# Patient Record
Sex: Male | Born: 1995 | Race: White | Hispanic: No | Marital: Single | Smoking: Never smoker
Health system: Southern US, Community
[De-identification: ages and names within clinical notes are randomized; demographics above are authoritative.]

## PROBLEM LIST (undated history)

## (undated) HISTORY — PX: HAND SURGERY: SHX662

---

## 2017-02-19 ENCOUNTER — Other Ambulatory Visit: Payer: Self-pay

## 2017-02-19 ENCOUNTER — Emergency Department (HOSPITAL_COMMUNITY)
Admission: EM | Admit: 2017-02-19 | Discharge: 2017-02-19 | Disposition: A | Discharge status: Home / Self Care | Payer: BLUE CROSS/BLUE SHIELD | Attending: Emergency Medicine | Admitting: Emergency Medicine

## 2017-02-19 ENCOUNTER — Encounter (HOSPITAL_COMMUNITY): Payer: Self-pay | Admitting: Emergency Medicine

## 2017-02-19 DIAGNOSIS — Z79899 Other long term (current) drug therapy: Secondary | ICD-10-CM | POA: Diagnosis not present

## 2017-02-19 DIAGNOSIS — R1013 Epigastric pain: Secondary | ICD-10-CM

## 2017-02-19 LAB — CBC
HEMATOCRIT: 43.1 % (ref 39.0–52.0)
Hemoglobin: 15 g/dL (ref 13.0–17.0)
MCH: 29.9 pg (ref 26.0–34.0)
MCHC: 34.8 g/dL (ref 30.0–36.0)
MCV: 86 fL (ref 78.0–100.0)
PLATELETS: 211 10*3/uL (ref 150–400)
RBC: 5.01 MIL/uL (ref 4.22–5.81)
RDW: 12.4 % (ref 11.5–15.5)
WBC: 8.9 10*3/uL (ref 4.0–10.5)

## 2017-02-19 LAB — COMPREHENSIVE METABOLIC PANEL
ALT: 12 U/L — ABNORMAL LOW (ref 17–63)
AST: 22 U/L (ref 15–41)
Albumin: 4.2 g/dL (ref 3.5–5.0)
Alkaline Phosphatase: 52 U/L (ref 38–126)
Anion gap: 6 (ref 5–15)
BILIRUBIN TOTAL: 0.8 mg/dL (ref 0.3–1.2)
BUN: 15 mg/dL (ref 6–20)
CO2: 27 mmol/L (ref 22–32)
Calcium: 9.5 mg/dL (ref 8.9–10.3)
Chloride: 104 mmol/L (ref 101–111)
Creatinine, Ser: 0.83 mg/dL (ref 0.61–1.24)
Glucose, Bld: 93 mg/dL (ref 65–99)
POTASSIUM: 4.1 mmol/L (ref 3.5–5.1)
Sodium: 137 mmol/L (ref 135–145)
TOTAL PROTEIN: 7.1 g/dL (ref 6.5–8.1)

## 2017-02-19 LAB — URINALYSIS, ROUTINE W REFLEX MICROSCOPIC
Bilirubin Urine: NEGATIVE
Glucose, UA: NEGATIVE mg/dL
Hgb urine dipstick: NEGATIVE
KETONES UR: NEGATIVE mg/dL
LEUKOCYTES UA: NEGATIVE
NITRITE: NEGATIVE
PH: 5 (ref 5.0–8.0)
PROTEIN: NEGATIVE mg/dL
Specific Gravity, Urine: 1.029 (ref 1.005–1.030)

## 2017-02-19 LAB — LIPASE, BLOOD: Lipase: 22 U/L (ref 11–51)

## 2017-02-19 MED ORDER — ALUM & MAG HYDROXIDE-SIMETH 400-400-40 MG/5ML PO SUSP: 5.0000 mL | Freq: Four times a day (QID) | ORAL | 0 refills | Status: AC | PRN

## 2017-02-19 MED ORDER — GI COCKTAIL ~~LOC~~
30.0000 mL | Freq: Once | ORAL | Status: AC
  Administered 2017-02-19: 30 mL via ORAL
  Filled 2017-02-19: qty 30

## 2017-02-19 MED ORDER — ESOMEPRAZOLE MAGNESIUM 40 MG PO CPDR: 40.0000 mg | DELAYED_RELEASE_CAPSULE | Freq: Every day | ORAL | 0 refills | Status: AC

## 2017-02-19 NOTE — ED Notes (Signed)
Pt c/o upper abd pain onset 19:00 last night. He states that he has had this pain before intermittently always when waking up. He used to have n/v with these episodes, but denies n/v today. Today, he tried Tums and ibuprofen.

## 2017-02-19 NOTE — ED Notes (Signed)
PA at bedside.

## 2017-02-19 NOTE — ED Notes (Signed)
PA advises that pt may have a GI cocktail and be d/c

## 2017-02-19 NOTE — Discharge Instructions (Signed)
Continue to use tums as needed for pain. Avoid ibuprofen, as this may not be the best medicine for your stomach pain. Take tylenol instead.  Start taking nexium once daily.  Follow up with the stomach doctors for further evaluation of your symptoms.  Return to the ER if you develop fevers, persistent vomiting, worsening pain, or any new or worsening symptoms.

## 2017-02-19 NOTE — ED Triage Notes (Signed)
Patient complaining of upper abdominal pain that started around 7 pm last night. Patient states it was a mild pain that started before dinner. Patient does not have nausea, vomiting, or diarrhea.

## 2017-02-19 NOTE — ED Provider Notes (Signed)
[facility]ENCY DEPT Provider Note   CSN: [id number] Arrival date & time: 02/19/17  [id number]     History   Chief Complaint Chief Complaint  Patient presents with  . Abdominal Pain    HPI Cory Gentry is a 21 y.o. male presenting with abdominal pain.  Patient states that at 7:00 last night, he started to have some epigastric abdominal pain.  It is described as a pressure or squeezing.  This began prior to dinner, and was mild until about 3 AM when the pain became more severe.  He took Tums and ibuprofen and a probiotic pill and had no improvement of his pain.  Pain is waxing and waning.  He denies associated nausea, vomiting, diarrhea, or constipation.  There is no pain in his lower abdomen.  He reports intermittent episodes of similar symptoms over the past several years, it has never been worked up before.  He states symptoms usually resolve with Tums and ibuprofen.  He denies fevers, chills, chest pain, shortness of breath, abdominal bloating, urinary symptoms, abnormal bowel movements.  He has no other medical problems, does not take medications daily.  He reports at this time, the pain has started to ease off.  He does not want anything for pain at this time.  HPI  History reviewed. No pertinent past medical history.  There are no active problems to display for this patient.   Past Surgical History:  Procedure Laterality Date  . HAND SURGERY         Home Medications    Prior to Admission medications   Medication Sig Start Date End Date Taking? Authorizing Provider  calcium carbonate (TUMS - DOSED IN MG ELEMENTAL CALCIUM) 500 MG chewable tablet Chew 1 tablet by mouth daily as needed for indigestion or heartburn.   Yes [provider]  ibuprofen (ADVIL,MOTRIN) 200 MG tablet Take 400 mg by mouth every 6 (six) hours as needed for mild pain.   Yes [provider]  Probiotic Product (PROBIOTIC ADVANCED PO) Take 1 capsule by mouth daily.    Yes [provider]  alum & mag hydroxide-simeth (MAALOX MAX) 400-400-40 MG/5ML suspension Take 5 mLs by mouth every 6 (six) hours as needed for indigestion. 02/19/17   Piccola Arico, PA-C  esomeprazole (NEXIUM) 40 MG capsule Take 1 capsule (40 mg total) by mouth daily. 02/19/17   Dayshon Roback, PA-C    Family History History reviewed. No pertinent family history.  Social History Social History   Tobacco Use  . Smoking status: Never Smoker  . Smokeless tobacco: Never Used  Substance Use Topics  . Alcohol use: Yes    Frequency: Never  . Drug use: No     Allergies   Patient has no known allergies.   Review of Systems Review of Systems  Gastrointestinal: Positive for abdominal pain.  All other systems reviewed and are negative.    Physical Exam Updated Vital Signs BP 120/76   Pulse 64   Temp 97.7 F (36.5 C) (Oral)   Resp 16   Ht 6\' 1"  (1.854 m)   Wt 80.3 kg (177 lb)   SpO2 99%   BMI 23.35 kg/m   Physical Exam  Constitutional: He is oriented to person, place, and time. He appears well-developed and well-nourished. No distress.  HENT:  Head: Normocephalic and atraumatic.  Eyes: EOM are normal.  Neck: Normal range of motion.  Cardiovascular: Normal rate, regular rhythm and intact distal pulses.  Pulmonary/Chest: Effort normal and breath sounds normal.  No respiratory distress. He has no wheezes.  Abdominal: Soft. Bowel sounds are normal. He exhibits no distension and no mass. There is tenderness. There is no rebound and no guarding.  Abd soft without rigidity, guarding or distention. No rebound. Negative murphys. Mild TTP of epigastric area.   Musculoskeletal: Normal range of motion.  Neurological: He is alert and oriented to person, place, and time.  Skin: Skin is warm and dry.  Psychiatric: He has a normal mood and affect.  Nursing note and vitals reviewed.    ED Treatments / Results  Labs (all labs ordered are listed, but only abnormal  results are displayed) Labs Reviewed  COMPREHENSIVE METABOLIC PANEL - Abnormal; Notable for the following components:      Result Value   ALT 12 (*)    All other components within normal limits  LIPASE, BLOOD  CBC  URINALYSIS, ROUTINE W REFLEX MICROSCOPIC    EKG  EKG Interpretation None       Radiology No results found.  Procedures Procedures (including critical care time)  Medications Ordered in ED Medications  gi cocktail (Maalox,Lidocaine,Donnatal) (30 mLs Oral Given 02/19/17 0915)     Initial Impression / Assessment and Plan / ED Course  I have reviewed the triage vital signs and the nursing notes.  Pertinent labs & imaging results that were available during my care of the patient were reviewed by me and considered in my medical decision making (see chart for details).     Patient presenting for evaluation of abdominal pain.  Physical examination, he is afebrile, not tachycardic, appears nontoxic.  Symptoms are improving without intervention.  Will obtain basic abdominal labs.  Patient does not want anything for pain at this time.  Denies nausea.  Likely GERD/gastric etiology.  I do not believe imaging is necessary at this time, although will reevaluate after lab work is returned.  Labs reassuring, no leukocytosis.  No abnormality of the liver or bilirubin.  Likely GERD.  Discussed findings with patient.  Patient reports some increased symptoms prior to discharge, will give GI cocktail.  Nexium given for home use and patient to follow-up with GI.  At this time, patient appears safe for discharge.  Return precautions given.  Patient states he understands and agrees to plan.   Final Clinical Impressions(s) / ED Diagnoses   Final diagnoses:  Epigastric abdominal pain    ED Discharge Orders        Ordered    esomeprazole (NEXIUM) 40 MG capsule  Daily     02/19/17 0849    alum & mag hydroxide-simeth (MAALOX MAX) 400-400-40 MG/5ML suspension  Every 6 hours PRN      02/19/17 0902       Alveria ApleyCaccavale, Annalise Mcdiarmid, PA-C 02/19/17 1641    Ward, Layla MawKristen N, DO 02/19/17 2313

## 2017-04-04 ENCOUNTER — Other Ambulatory Visit: Payer: Self-pay | Admitting: Physician Assistant

## 2017-04-04 DIAGNOSIS — R1013 Epigastric pain: Secondary | ICD-10-CM

## 2017-04-04 DIAGNOSIS — R1033 Periumbilical pain: Secondary | ICD-10-CM

## 2017-04-07 ENCOUNTER — Ambulatory Visit
Admission: RE | Admit: 2017-04-07 | Discharge: 2017-04-07 | Disposition: A | Discharge status: Home / Self Care | Payer: BLUE CROSS/BLUE SHIELD | Source: Ambulatory Visit | Attending: Physician Assistant | Admitting: Physician Assistant

## 2017-04-07 DIAGNOSIS — R1013 Epigastric pain: Secondary | ICD-10-CM

## 2017-04-07 DIAGNOSIS — R1033 Periumbilical pain: Secondary | ICD-10-CM

## 2019-12-12 IMAGING — US US ABDOMEN COMPLETE
1 series · 14 of 25 positions shown · non-contrast
Comparison: None

CLINICAL DATA: Epigastric and periumbilical abdominal pain for 2
years

EXAM:
ABDOMEN ULTRASOUND COMPLETE

[Series 1: us abdomen complete · 0.14mm/px · 14 of 102 slices shown]
[im 1/102]
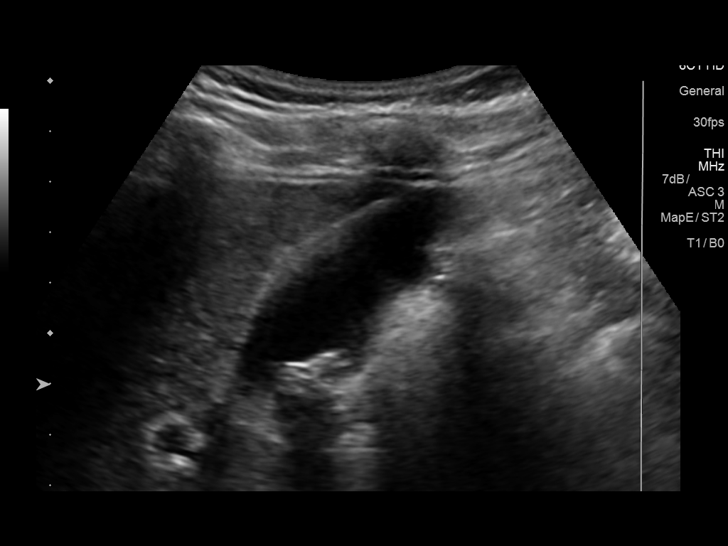
[im 9/102]
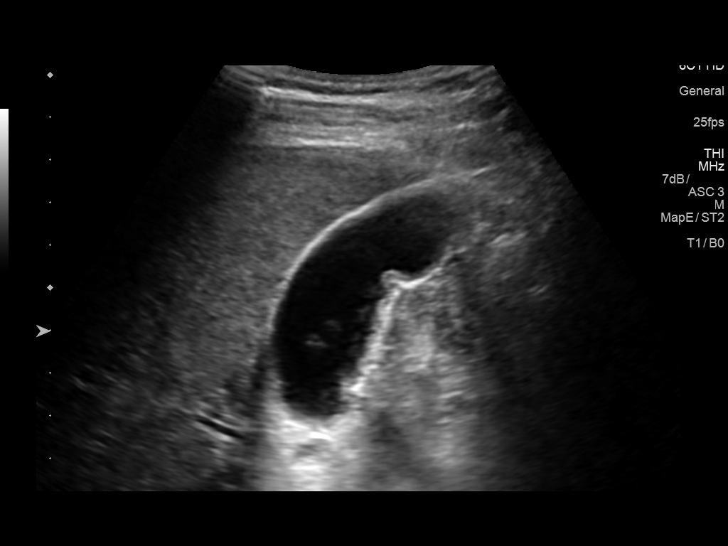
[im 17/102]
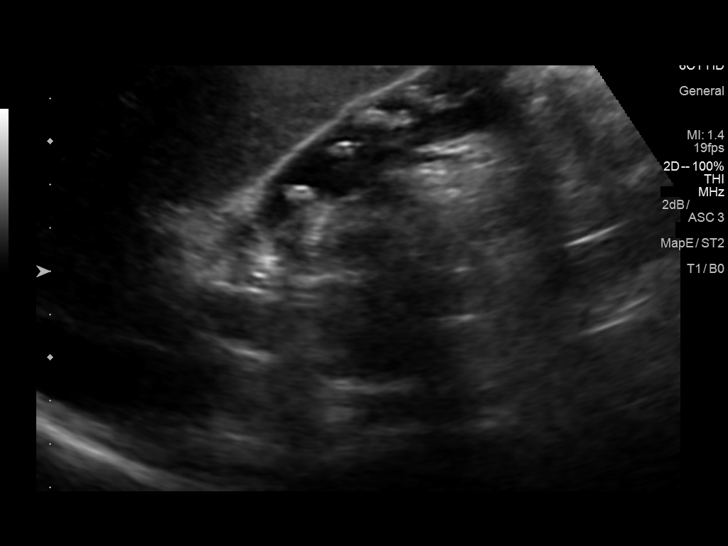
[im 26/102]
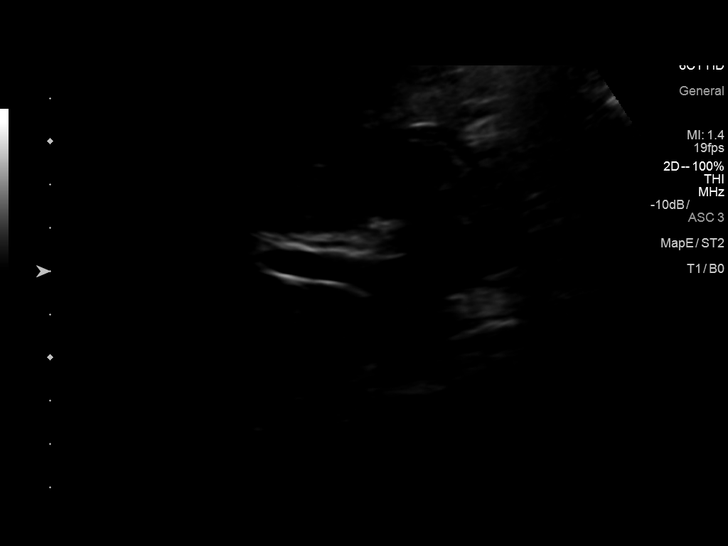
[im 34/102]
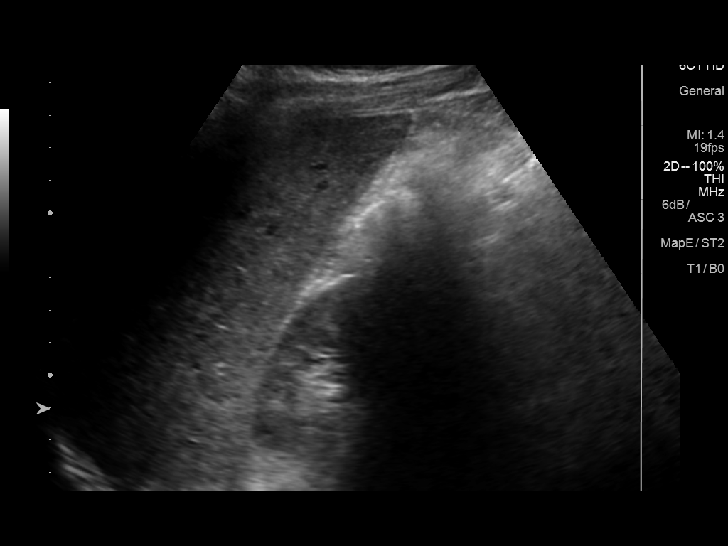
[im 38/102]
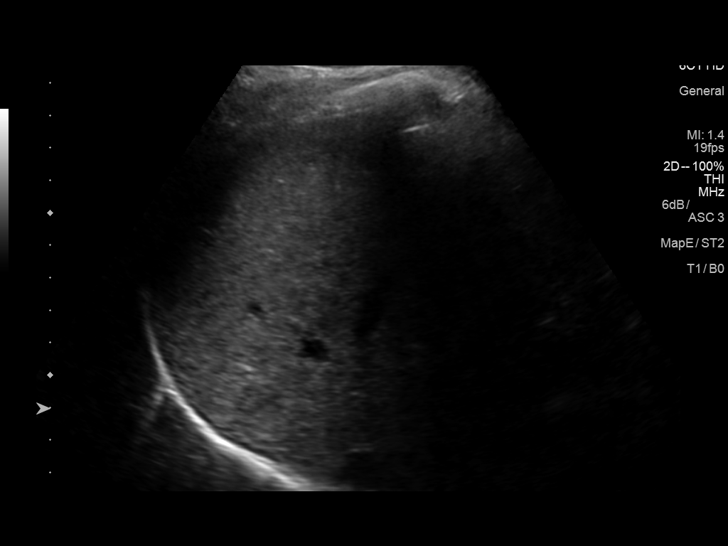
[im 47/102]
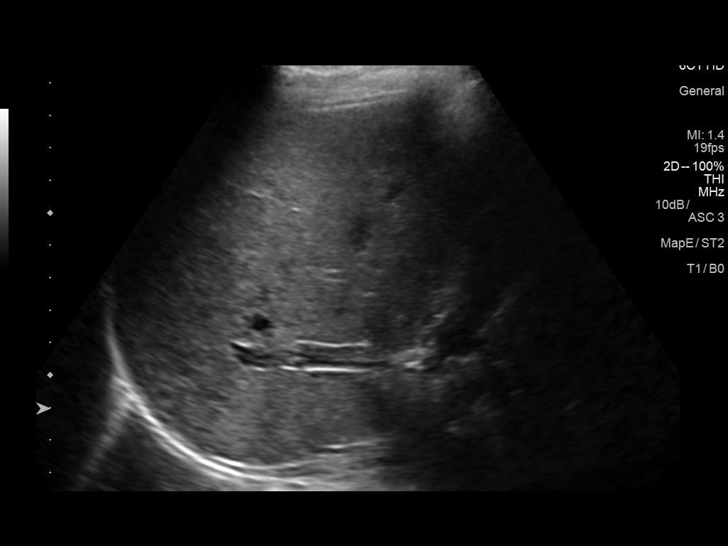
[im 55/102]
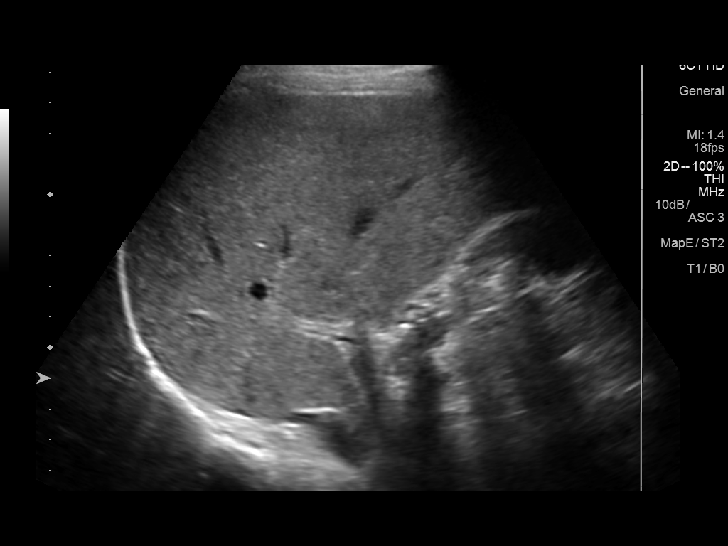
[im 64/102]
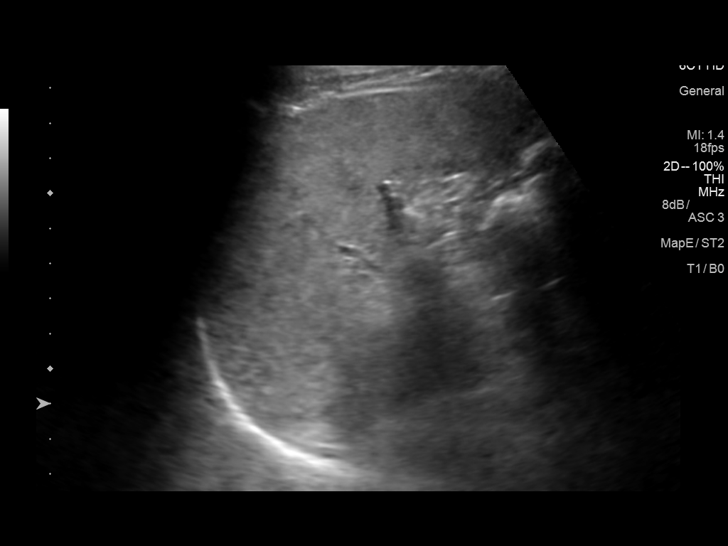
[im 68/102]
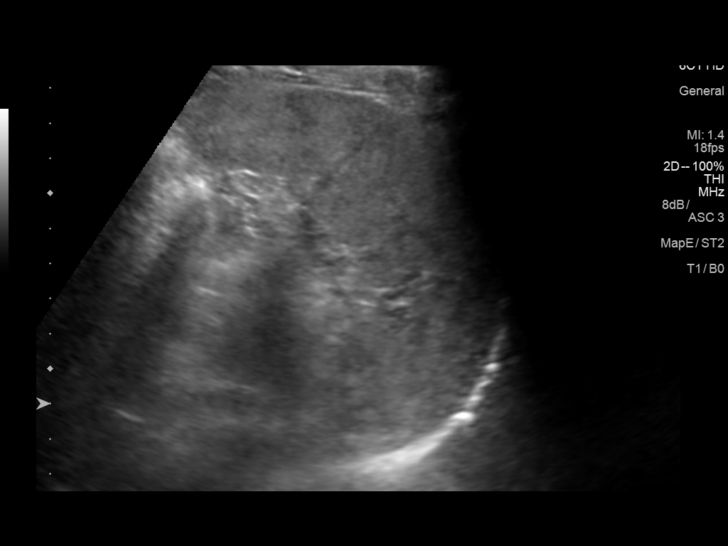
[im 76/102]
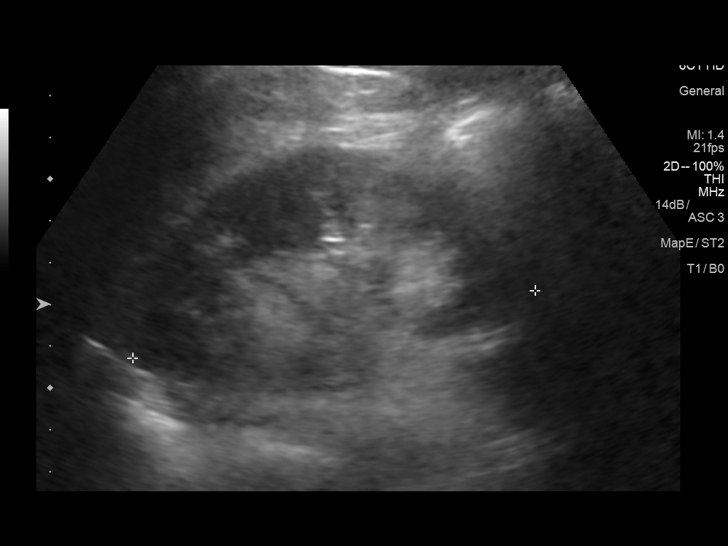
[im 85/102]
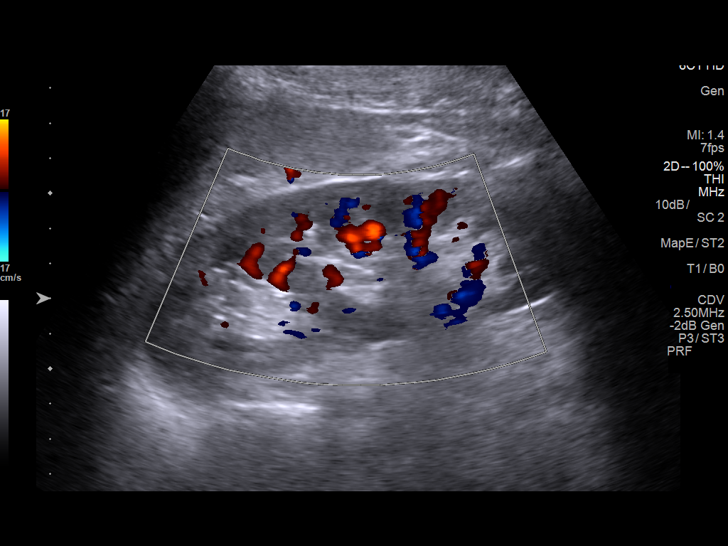
[im 93/102]
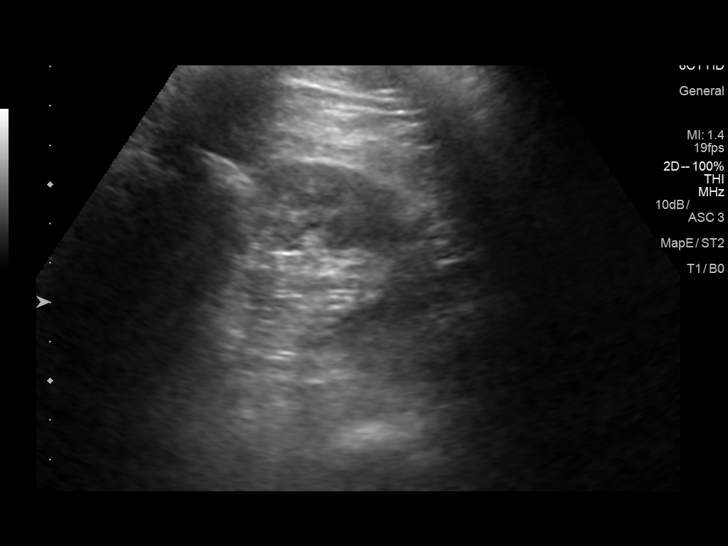
[im 102/102]
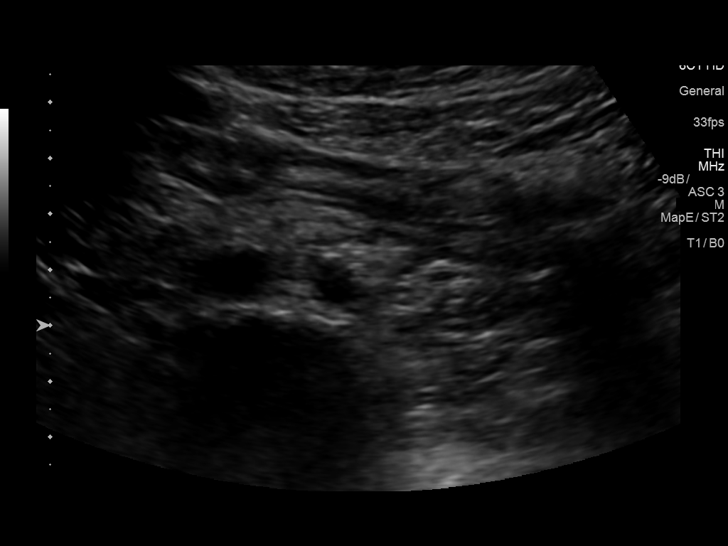

[14 of 25 positions shown; findings below may reference images not displayed]

FINDINGS: Gallbladder: Shadowing calculi within gallbladder up to 15 mm
diameter. No gallbladder wall thickening, pericholecystic fluid, or
sonographic Murphy sign.

Common bile duct: Diameter: 2 mm diameter, normal

Liver: Normal appearance. Portal vein is patent on color Doppler
imaging with normal direction of blood flow towards the liver.

IVC: Normal appearance

Pancreas: Normal appearance

Spleen: Normal appearance, 9.2 cm length

Right Kidney: Length: 10.2 cm. Normal morphology without mass or
hydronephrosis.

Left Kidney: Length: 12.5 cm. Normal morphology without mass or
hydronephrosis.

Abdominal aorta: Normal caliber

Other findings: No free fluid
IMPRESSION: Cholelithiasis without evidence acute cholecystitis or biliary
dilatation.

Remainder of exam unremarkable.
